# Patient Record
Sex: Male | Born: 1985 | Race: White | Hispanic: No | Marital: Single
Health system: Southern US, Community
[De-identification: ages and names within clinical notes are randomized; demographics above are authoritative.]

---

## 2016-10-10 DIAGNOSIS — Y939 Activity, unspecified: Secondary | ICD-10-CM | POA: Diagnosis not present

## 2016-10-10 DIAGNOSIS — W228XXA Striking against or struck by other objects, initial encounter: Secondary | ICD-10-CM | POA: Insufficient documentation

## 2016-10-10 DIAGNOSIS — Z79899 Other long term (current) drug therapy: Secondary | ICD-10-CM | POA: Diagnosis not present

## 2016-10-10 DIAGNOSIS — Y929 Unspecified place or not applicable: Secondary | ICD-10-CM | POA: Insufficient documentation

## 2016-10-10 DIAGNOSIS — S069X9A Unspecified intracranial injury with loss of consciousness of unspecified duration, initial encounter: Secondary | ICD-10-CM | POA: Insufficient documentation

## 2016-10-10 DIAGNOSIS — Y999 Unspecified external cause status: Secondary | ICD-10-CM | POA: Diagnosis not present
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
## 2016-10-11 ENCOUNTER — Emergency Department (HOSPITAL_COMMUNITY): Payer: Medicare Other

## 2016-10-11 ENCOUNTER — Emergency Department (HOSPITAL_COMMUNITY)
Admission: EM | Admit: 2016-10-11 | Discharge: 2016-10-11 | Disposition: A | Discharge status: Home / Self Care | Payer: Medicare Other | Attending: Emergency Medicine | Admitting: Emergency Medicine

## 2016-10-11 DIAGNOSIS — S0990XA Unspecified injury of head, initial encounter: Secondary | ICD-10-CM

## 2016-10-11 DIAGNOSIS — S069X9A Unspecified intracranial injury with loss of consciousness of unspecified duration, initial encounter: Secondary | ICD-10-CM | POA: Diagnosis not present

## 2016-10-11 MED ORDER — ACETAMINOPHEN 500 MG PO TABS
1000.0000 mg | ORAL_TABLET | Freq: Once | ORAL | Status: AC
  Administered 2016-10-11: 1000 mg via ORAL
  Filled 2016-10-11: qty 2

## 2016-10-11 NOTE — Discharge Instructions (Signed)
Your CT scans today do not show any acute/emergent condition.  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  You can discuss this finding with your Primary Care Doctor, but no intervention is needed at this time.
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
